# Patient Record
Sex: Male | Born: 1975 | Race: White | Hispanic: No | Marital: Single | Smoking: Current every day smoker
Health system: Southern US, Community
[De-identification: ages and names within clinical notes are randomized; demographics above are authoritative.]

## PROBLEM LIST (undated history)

## (undated) DIAGNOSIS — F329 Major depressive disorder, single episode, unspecified: Secondary | ICD-10-CM

## (undated) DIAGNOSIS — F1910 Other psychoactive substance abuse, uncomplicated: Secondary | ICD-10-CM

## (undated) DIAGNOSIS — B1920 Unspecified viral hepatitis C without hepatic coma: Secondary | ICD-10-CM

## (undated) DIAGNOSIS — F32A Depression, unspecified: Secondary | ICD-10-CM

---

## 2012-08-07 ENCOUNTER — Encounter (HOSPITAL_COMMUNITY): Payer: Self-pay | Admitting: Emergency Medicine

## 2012-08-07 ENCOUNTER — Emergency Department (HOSPITAL_COMMUNITY)
Admission: EM | Admit: 2012-08-07 | Discharge: 2012-08-07 | Disposition: A | Discharge status: Home / Self Care | Payer: No Typology Code available for payment source | Attending: Emergency Medicine | Admitting: Emergency Medicine

## 2012-08-07 ENCOUNTER — Emergency Department (HOSPITAL_COMMUNITY): Payer: Self-pay

## 2012-08-07 ENCOUNTER — Emergency Department (HOSPITAL_COMMUNITY): Payer: No Typology Code available for payment source

## 2012-08-07 DIAGNOSIS — F3289 Other specified depressive episodes: Secondary | ICD-10-CM | POA: Insufficient documentation

## 2012-08-07 DIAGNOSIS — S43109A Unspecified dislocation of unspecified acromioclavicular joint, initial encounter: Secondary | ICD-10-CM | POA: Insufficient documentation

## 2012-08-07 DIAGNOSIS — F0781 Postconcussional syndrome: Secondary | ICD-10-CM

## 2012-08-07 DIAGNOSIS — R51 Headache: Secondary | ICD-10-CM | POA: Insufficient documentation

## 2012-08-07 DIAGNOSIS — S43101A Unspecified dislocation of right acromioclavicular joint, initial encounter: Secondary | ICD-10-CM

## 2012-08-07 DIAGNOSIS — Y9389 Activity, other specified: Secondary | ICD-10-CM | POA: Insufficient documentation

## 2012-08-07 DIAGNOSIS — F329 Major depressive disorder, single episode, unspecified: Secondary | ICD-10-CM | POA: Insufficient documentation

## 2012-08-07 DIAGNOSIS — Z79899 Other long term (current) drug therapy: Secondary | ICD-10-CM | POA: Insufficient documentation

## 2012-08-07 DIAGNOSIS — R4182 Altered mental status, unspecified: Secondary | ICD-10-CM | POA: Insufficient documentation

## 2012-08-07 DIAGNOSIS — Y92410 Unspecified street and highway as the place of occurrence of the external cause: Secondary | ICD-10-CM | POA: Insufficient documentation

## 2012-08-07 HISTORY — DX: Major depressive disorder, single episode, unspecified: F32.9

## 2012-08-07 HISTORY — DX: Depression, unspecified: F32.A

## 2012-08-07 LAB — AMMONIA: Ammonia: 50 umol/L (ref 11–60)

## 2012-08-07 LAB — COMPREHENSIVE METABOLIC PANEL
Alkaline Phosphatase: 73 U/L (ref 39–117)
BUN: 14 mg/dL (ref 6–23)
CO2: 30 mEq/L (ref 19–32)
Chloride: 102 mEq/L (ref 96–112)
GFR calc Af Amer: >90 mL/min (ref >90)
GFR calc non Af Amer: >90 mL/min (ref >90)
Glucose, Bld: 81 mg/dL (ref 70–99)
Potassium: 3.8 mEq/L (ref 3.5–5.1)
Total Bilirubin: 0.9 mg/dL (ref 0.3–1.2)

## 2012-08-07 MED ORDER — OXYCODONE HCL 5 MG PO TABS: 5.0000 mg | ORAL_TABLET | ORAL | Status: DC | PRN

## 2012-08-07 MED ORDER — KETOROLAC TROMETHAMINE 60 MG/2ML IM SOLN
60.0000 mg | Freq: Once | INTRAMUSCULAR | Status: AC
  Administered 2012-08-07: 60 mg via INTRAMUSCULAR
  Filled 2012-08-07: qty 2

## 2012-08-07 MED ORDER — DIAZEPAM 5 MG PO TABS
5.0000 mg | ORAL_TABLET | Freq: Once | ORAL | Status: AC
  Administered 2012-08-07: 5 mg via ORAL
  Filled 2012-08-07: qty 1

## 2012-08-07 NOTE — ED Notes (Signed)
Has had hx of liver issues he states afterbitten by ticklast year and was told he had hep C
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
## 2012-08-08 NOTE — ED Provider Notes (Signed)
Medical screening examination/treatment/procedure(s) were performed by non-physician practitioner and as supervising physician I was immediately available for consultation/collaboration.  Zoila Ditullio L [place], MD 08/08/12 1911 

## 2017-04-21 ENCOUNTER — Other Ambulatory Visit: Payer: Self-pay

## 2017-04-21 ENCOUNTER — Emergency Department (HOSPITAL_COMMUNITY): Payer: Self-pay

## 2017-04-21 ENCOUNTER — Inpatient Hospital Stay (HOSPITAL_COMMUNITY)
Admission: EM | Admit: 2017-04-21 | Discharge: 2017-04-22 | DRG: 089 | Disposition: A | Discharge status: Home / Self Care | Payer: Self-pay | Attending: General Surgery | Admitting: General Surgery

## 2017-04-21 ENCOUNTER — Encounter (HOSPITAL_COMMUNITY): Payer: Self-pay | Admitting: Emergency Medicine

## 2017-04-21 DIAGNOSIS — Z886 Allergy status to analgesic agent status: Secondary | ICD-10-CM

## 2017-04-21 DIAGNOSIS — F1910 Other psychoactive substance abuse, uncomplicated: Secondary | ICD-10-CM | POA: Diagnosis present

## 2017-04-21 DIAGNOSIS — S060XAA Concussion with loss of consciousness status unknown, initial encounter: Secondary | ICD-10-CM | POA: Diagnosis present

## 2017-04-21 DIAGNOSIS — S060X9A Concussion with loss of consciousness of unspecified duration, initial encounter: Principal | ICD-10-CM | POA: Diagnosis present

## 2017-04-21 DIAGNOSIS — S41112A Laceration without foreign body of left upper arm, initial encounter: Secondary | ICD-10-CM

## 2017-04-21 DIAGNOSIS — Z23 Encounter for immunization: Secondary | ICD-10-CM

## 2017-04-21 DIAGNOSIS — Z79899 Other long term (current) drug therapy: Secondary | ICD-10-CM

## 2017-04-21 DIAGNOSIS — T07XXXA Unspecified multiple injuries, initial encounter: Secondary | ICD-10-CM

## 2017-04-21 DIAGNOSIS — S42435A Nondisplaced fracture (avulsion) of lateral epicondyle of left humerus, initial encounter for closed fracture: Secondary | ICD-10-CM | POA: Diagnosis present

## 2017-04-21 DIAGNOSIS — S42445A Nondisplaced fracture (avulsion) of medial epicondyle of left humerus, initial encounter for closed fracture: Secondary | ICD-10-CM

## 2017-04-21 DIAGNOSIS — S41119A Laceration without foreign body of unspecified upper arm, initial encounter: Secondary | ICD-10-CM | POA: Diagnosis present

## 2017-04-21 DIAGNOSIS — S50312A Abrasion of left elbow, initial encounter: Secondary | ICD-10-CM | POA: Diagnosis present

## 2017-04-21 DIAGNOSIS — R413 Other amnesia: Secondary | ICD-10-CM | POA: Diagnosis present

## 2017-04-21 DIAGNOSIS — Y92410 Unspecified street and highway as the place of occurrence of the external cause: Secondary | ICD-10-CM

## 2017-04-21 DIAGNOSIS — F17210 Nicotine dependence, cigarettes, uncomplicated: Secondary | ICD-10-CM | POA: Diagnosis present

## 2017-04-21 DIAGNOSIS — S52125A Nondisplaced fracture of head of left radius, initial encounter for closed fracture: Secondary | ICD-10-CM

## 2017-04-21 DIAGNOSIS — S0081XA Abrasion of other part of head, initial encounter: Secondary | ICD-10-CM | POA: Diagnosis present

## 2017-04-21 HISTORY — DX: Other psychoactive substance abuse, uncomplicated: F19.10

## 2017-04-21 HISTORY — DX: Unspecified viral hepatitis C without hepatic coma: B19.20

## 2017-04-21 LAB — CBC WITH DIFFERENTIAL/PLATELET
BASOS PCT: 0 %
Basophils Absolute: 0 10*3/uL (ref 0.0–0.1)
EOS ABS: 0 10*3/uL (ref 0.0–0.7)
Eosinophils Relative: 0 %
HCT: 41.2 % (ref 39.0–52.0)
Hemoglobin: 14 g/dL (ref 13.0–17.0)
Lymphocytes Relative: 10 %
Lymphs Abs: 1.5 10*3/uL (ref 0.7–4.0)
MCH: 30.2 pg (ref 26.0–34.0)
MCHC: 34 g/dL (ref 30.0–36.0)
MCV: 89 fL (ref 78.0–100.0)
MONO ABS: 0.8 10*3/uL (ref 0.1–1.0)
MONOS PCT: 5 %
Neutro Abs: 13 10*3/uL — ABNORMAL HIGH (ref 1.7–7.7)
Neutrophils Relative %: 85 %
PLATELETS: 214 10*3/uL (ref 150–400)
RBC: 4.63 MIL/uL (ref 4.22–5.81)
RDW: 13.3 % (ref 11.5–15.5)
WBC: 15.5 10*3/uL — ABNORMAL HIGH (ref 4.0–10.5)

## 2017-04-21 LAB — COMPREHENSIVE METABOLIC PANEL
ALBUMIN: 3.8 g/dL (ref 3.5–5.0)
ALK PHOS: 47 U/L (ref 38–126)
ALT: 45 U/L (ref 17–63)
AST: 45 U/L — ABNORMAL HIGH (ref 15–41)
Anion gap: 9 (ref 5–15)
BILIRUBIN TOTAL: 1.3 mg/dL — AB (ref 0.3–1.2)
BUN: 11 mg/dL (ref 6–20)
CALCIUM: 9.2 mg/dL (ref 8.9–10.3)
CO2: 25 mmol/L (ref 22–32)
CREATININE: 0.86 mg/dL (ref 0.61–1.24)
Chloride: 105 mmol/L (ref 101–111)
GFR calc non Af Amer: >60 mL/min (ref >60)
GLUCOSE: 109 mg/dL — AB (ref 65–99)
Potassium: 3.6 mmol/L (ref 3.5–5.1)
SODIUM: 139 mmol/L (ref 135–145)
TOTAL PROTEIN: 7.2 g/dL (ref 6.5–8.1)

## 2017-04-21 LAB — RAPID URINE DRUG SCREEN, HOSP PERFORMED
Amphetamines: POSITIVE — AB
BENZODIAZEPINES: POSITIVE — AB
Barbiturates: NOT DETECTED
Cocaine: NOT DETECTED
OPIATES: POSITIVE — AB
Tetrahydrocannabinol: POSITIVE — AB

## 2017-04-21 MED ORDER — LIDOCAINE-EPINEPHRINE (PF) 2 %-1:200000 IJ SOLN
10.0000 mL | Freq: Once | INTRAMUSCULAR | Status: AC
  Administered 2017-04-21: 10 mL
  Filled 2017-04-21: qty 20

## 2017-04-21 MED ORDER — MECLIZINE HCL 25 MG PO TABS: 25.0000 mg | ORAL_TABLET | Freq: Three times a day (TID) | ORAL | 0 refills | Status: DC | PRN

## 2017-04-21 MED ORDER — TETANUS-DIPHTH-ACELL PERTUSSIS 5-2.5-18.5 LF-MCG/0.5 IM SUSP
0.5000 mL | Freq: Once | INTRAMUSCULAR | Status: AC
  Administered 2017-04-21: 0.5 mL via INTRAMUSCULAR
  Filled 2017-04-21: qty 0.5

## 2017-04-21 MED ORDER — CEPHALEXIN 500 MG PO CAPS: 500.0000 mg | ORAL_CAPSULE | Freq: Three times a day (TID) | ORAL | 0 refills | Status: DC

## 2017-04-21 MED ORDER — CLINDAMYCIN PHOSPHATE 900 MG/50ML IV SOLN: 900.0000 mg | Freq: Once | INTRAVENOUS | Status: DC

## 2017-04-21 MED ORDER — LIDOCAINE-EPINEPHRINE-TETRACAINE (LET) SOLUTION
3.0000 mL | Freq: Once | NASAL | Status: AC
  Administered 2017-04-21: 3 mL via TOPICAL
  Filled 2017-04-21: qty 3

## 2017-04-21 MED ORDER — MECLIZINE HCL 25 MG PO TABS
25.0000 mg | ORAL_TABLET | Freq: Once | ORAL | Status: AC
  Administered 2017-04-22: 25 mg via ORAL
  Filled 2017-04-21: qty 1

## 2017-04-21 MED ORDER — NAPROXEN 500 MG PO TABS: 500.0000 mg | ORAL_TABLET | Freq: Two times a day (BID) | ORAL | 0 refills | Status: DC | PRN

## 2017-04-21 MED ORDER — METOCLOPRAMIDE HCL 5 MG/ML IJ SOLN
10.0000 mg | Freq: Once | INTRAMUSCULAR | Status: AC
  Administered 2017-04-21: 10 mg via INTRAVENOUS
  Filled 2017-04-21: qty 2

## 2017-04-21 MED ORDER — ONDANSETRON 4 MG PO TBDP: 4.0000 mg | ORAL_TABLET | Freq: Three times a day (TID) | ORAL | 0 refills | Status: AC | PRN

## 2017-04-21 MED ORDER — MORPHINE SULFATE (PF) 4 MG/ML IV SOLN
4.0000 mg | Freq: Once | INTRAVENOUS | Status: AC
  Administered 2017-04-21: 4 mg via INTRAVENOUS
  Filled 2017-04-21: qty 1

## 2017-04-21 MED ORDER — ONDANSETRON HCL 4 MG/2ML IJ SOLN
4.0000 mg | Freq: Once | INTRAMUSCULAR | Status: AC
  Administered 2017-04-21: 4 mg via INTRAVENOUS
  Filled 2017-04-21: qty 2

## 2017-04-21 MED ORDER — IOPAMIDOL (ISOVUE-300) INJECTION 61%
INTRAVENOUS | Status: AC
  Administered 2017-04-21: 15:00:00
  Filled 2017-04-21: qty 100

## 2017-04-21 NOTE — ED Provider Notes (Signed)
..Laceration Repair Date/Time: 04/21/2017 8:26 PM Performed by: Emi HolesLaw, Austine Wiedeman M, PA-C Authorized by: Emi HolesLaw, Trena Dunavan M, PA-C   Consent:    Consent obtained:  Verbal   Consent given by:  Patient   Risks discussed:  Infection, pain and poor cosmetic result   Alternatives discussed:  No treatment Anesthesia (see MAR for exact dosages):    Anesthesia method:  Topical application and local infiltration   Topical anesthetic:  LET   Local anesthetic:  Lidocaine 2% WITH epi Laceration details:    Location:  Shoulder/arm   Shoulder/arm location:  L elbow   Length (cm):  4   Depth (mm):  3 Repair type:    Repair type:  Intermediate Pre-procedure details:    Preparation:  Patient was prepped and draped in usual sterile fashion and imaging obtained to evaluate for foreign bodies Exploration:    Hemostasis achieved with:  Epinephrine and direct pressure   Wound exploration: wound explored through full range of motion and entire depth of wound probed and visualized     Contaminated: yes   Treatment:    Area cleansed with:  Saline   Amount of cleaning:  Extensive   Irrigation solution:  Sterile saline   Irrigation volume:  400 mL   Irrigation method:  Syringe   Visualized foreign bodies/material removed: no   Skin repair:    Repair method:  Sutures   Suture size:  4-0   Wound skin closure material used: Ethilon.   Suture technique:  Vertical mattress   Number of sutures:  2 Approximation:    Approximation:  Loose   Vermilion border: well-aligned   Post-procedure details:    Dressing:  Antibiotic ointment, splint for protection and non-adherent dressing (roll gauze)   Patient tolerance of procedure:  Tolerated well, no immediate complications .Marland Kitchen.Laceration Repair Date/Time: 04/21/2017 8:28 PM Performed by: Emi HolesLaw, Danaye Sobh M, PA-C Authorized by: Emi HolesLaw, Kacey Vicuna M, PA-C   Consent:    Consent obtained:  Verbal   Consent given by:  Patient   Risks discussed:  Infection, poor cosmetic  result and pain   Alternatives discussed:  No treatment Anesthesia (see MAR for exact dosages):    Anesthesia method:  Local infiltration   Local anesthetic:  Lidocaine 2% WITH epi Laceration details:    Location:  Shoulder/arm   Shoulder/arm location:  L elbow   Length (cm):  1   Depth (mm):  2 Repair type:    Repair type:  Simple Pre-procedure details:    Preparation:  Patient was prepped and draped in usual sterile fashion and imaging obtained to evaluate for foreign bodies Exploration:    Wound exploration: wound explored through full range of motion and entire depth of wound probed and visualized     Contaminated: no   Treatment:    Area cleansed with:  Saline   Irrigation solution:  Sterile water   Irrigation volume:  30 mL   Irrigation method:  Syringe   Visualized foreign bodies/material removed: no   Skin repair:    Repair method:  Sutures   Suture size:  4-0   Wound skin closure material used: Ethilon.   Suture technique:  Simple interrupted   Number of sutures:  1 Approximation:    Approximation:  Close   Vermilion border: well-aligned   Post-procedure details:    Dressing:  Antibiotic ointment, splint for protection and sterile dressing (roll gauze)   Patient tolerance of procedure:  Tolerated well, no immediate complications .Marland Kitchen.Laceration Repair Date/Time: 04/21/2017 8:30 PM Performed  by: Emi HolesLaw, Garry Nicolini M, PA-C Authorized by: Emi HolesLaw, Ishana Blades M, PA-C   Consent:    Consent obtained:  Verbal   Consent given by:  Patient   Risks discussed:  Infection, pain and poor cosmetic result   Alternatives discussed:  No treatment Anesthesia (see MAR for exact dosages):    Anesthesia method:  Local infiltration   Local anesthetic:  Lidocaine 2% WITH epi Laceration details:    Location:  Shoulder/arm   Shoulder/arm location:  L lower arm   Length (cm):  1   Depth (mm):  5 Repair type:    Repair type:  Simple Pre-procedure details:    Preparation:  Patient was prepped  and draped in usual sterile fashion and imaging obtained to evaluate for foreign bodies Exploration:    Hemostasis achieved with:  Direct pressure   Wound exploration: wound explored through full range of motion and entire depth of wound probed and visualized     Wound extent: foreign bodies/material     Contaminated: yes   Treatment:    Area cleansed with:  Saline   Amount of cleaning:  Standard   Irrigation solution:  Sterile water   Irrigation volume:  50 mL   Irrigation method:  Syringe   Visualized foreign bodies/material removed: yes   Skin repair:    Repair method:  Sutures   Suture size:  4-0   Wound skin closure material used: Ethilon.   Number of sutures:  1 Approximation:    Approximation:  Loose   Vermilion border: well-aligned   Post-procedure details:    Dressing:  Antibiotic ointment, splint for protection and sterile dressing   Patient tolerance of procedure:  Tolerated well, no immediate complications .Foreign Body Removal Date/Time: 04/21/2017 8:31 PM Performed by: Emi HolesLaw, Carzell Saldivar M, PA-C Authorized by: Emi HolesLaw, Alysiana Ethridge M, PA-C  Consent: Verbal consent obtained. Consent given by: patient Patient identity confirmed: verbally with patient Body area: skin General location: upper extremity Location details: left forearm Anesthesia: local infiltration  Anesthesia: Local Anesthetic: lidocaine 2% with epinephrine Anesthetic total: 2 mL  Sedation: Patient sedated: no  Patient restrained: no Patient cooperative: yes Localization method: visualized (X-ray) Removal mechanism: forceps Dressing: antibiotic ointment Tendon involvement: none Depth: subcutaneous Complexity: simple 1 objects recovered. Objects recovered: ~435mm cube of glass Post-procedure assessment: foreign body removed Patient tolerance: Patient tolerated the procedure well with no immediate complications      Emi HolesLaw, Mackensi Mahadeo M, PA-C 04/21/17 2032    Little, Ambrose Finlandachel Morgan, MD 04/21/17  2034
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
## 2017-04-21 NOTE — ED Notes (Signed)
Patient transported to CT 
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
## 2017-04-21 NOTE — ED Notes (Signed)
Pt stated that he was not willing to ambulate in the hallway, but did ambulate in the room prior to providing a urine sample. His parents stated that he was unsteady on his feet even attempting to stand still. While walking around the bed, his parents stated that they would be "unwilling to leave him alone".

## 2017-04-21 NOTE — Progress Notes (Signed)
Orthopedic Tech Progress Note Patient Details:  Dwayne Benson 04/18/1976 [id number]  Ortho Devices Type of Ortho Device: Shoulder immobilizer Ortho Device/Splint Location: LUE Ortho Device/Splint Interventions: Ordered, Application   Post Interventions Patient Tolerated: Well Instructions Provided: Care of device   Jennye MoccasinHughes, Margean Korell Craig 04/21/2017, 8:39 PM

## 2017-04-21 NOTE — ED Notes (Addendum)
Pt back from x-ray at 14:30 and also notified that he needs to provide a urine sample.
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
## 2017-04-21 NOTE — ED Notes (Signed)
Patient transported to X-ray 

## 2017-04-22 ENCOUNTER — Other Ambulatory Visit: Payer: Self-pay

## 2017-04-22 LAB — BASIC METABOLIC PANEL
ANION GAP: 6 (ref 5–15)
BUN: 9 mg/dL (ref 6–20)
CHLORIDE: 104 mmol/L (ref 101–111)
CO2: 27 mmol/L (ref 22–32)
Calcium: 8.8 mg/dL — ABNORMAL LOW (ref 8.9–10.3)
Creatinine, Ser: 0.7 mg/dL (ref 0.61–1.24)
Glucose, Bld: 110 mg/dL — ABNORMAL HIGH (ref 65–99)
POTASSIUM: 3.6 mmol/L (ref 3.5–5.1)
SODIUM: 137 mmol/L (ref 135–145)

## 2017-04-22 LAB — CBC
HCT: 39.3 % (ref 39.0–52.0)
HEMOGLOBIN: 13.2 g/dL (ref 13.0–17.0)
MCH: 29.9 pg (ref 26.0–34.0)
MCHC: 33.6 g/dL (ref 30.0–36.0)
MCV: 88.9 fL (ref 78.0–100.0)
PLATELETS: 217 10*3/uL (ref 150–400)
RBC: 4.42 MIL/uL (ref 4.22–5.81)
RDW: 13.1 % (ref 11.5–15.5)
WBC: 8.8 10*3/uL (ref 4.0–10.5)

## 2017-04-22 LAB — HIV ANTIBODY (ROUTINE TESTING W REFLEX): HIV Screen 4th Generation wRfx: NONREACTIVE

## 2017-04-22 MED ORDER — MECLIZINE HCL 25 MG PO TABS: 25.0000 mg | ORAL_TABLET | Freq: Three times a day (TID) | ORAL | 0 refills | Status: AC | PRN

## 2017-04-22 MED ORDER — OXYCODONE HCL 5 MG PO TABS: 5.0000 mg | ORAL_TABLET | ORAL | Status: DC | PRN

## 2017-04-22 MED ORDER — HYDRALAZINE HCL 20 MG/ML IJ SOLN: 10.0000 mg | INTRAMUSCULAR | Status: DC | PRN

## 2017-04-22 MED ORDER — CEPHALEXIN 500 MG PO CAPS: 500.0000 mg | ORAL_CAPSULE | Freq: Three times a day (TID) | ORAL | 0 refills | Status: AC

## 2017-04-22 MED ORDER — PANTOPRAZOLE SODIUM 40 MG IV SOLR: 40.0000 mg | Freq: Every day | INTRAVENOUS | Status: DC

## 2017-04-22 MED ORDER — ESCITALOPRAM OXALATE 10 MG PO TABS
20.0000 mg | ORAL_TABLET | Freq: Every day | ORAL | Status: DC
Start: 2017-04-22 — End: 2017-04-23
  Administered 2017-04-22: 20 mg via ORAL
  Filled 2017-04-22: qty 2

## 2017-04-22 MED ORDER — PANTOPRAZOLE SODIUM 40 MG PO TBEC
40.0000 mg | DELAYED_RELEASE_TABLET | Freq: Every day | ORAL | Status: DC
  Administered 2017-04-22: 40 mg via ORAL
  Filled 2017-04-22: qty 1

## 2017-04-22 MED ORDER — POTASSIUM CHLORIDE IN NACL 20-0.9 MEQ/L-% IV SOLN
INTRAVENOUS | Status: DC
  Administered 2017-04-22: 01:00:00 via INTRAVENOUS
  Filled 2017-04-22: qty 1000

## 2017-04-22 MED ORDER — MORPHINE SULFATE (PF) 2 MG/ML IV SOLN
2.0000 mg | INTRAVENOUS | Status: DC | PRN
  Administered 2017-04-22: 2 mg via INTRAVENOUS
  Filled 2017-04-22: qty 1

## 2017-04-22 MED ORDER — IBUPROFEN 200 MG PO TABS
600.0000 mg | ORAL_TABLET | Freq: Four times a day (QID) | ORAL | Status: DC | PRN
  Administered 2017-04-22: 600 mg via ORAL
  Filled 2017-04-22: qty 3

## 2017-04-22 MED ORDER — OXYCODONE HCL 5 MG PO TABS
10.0000 mg | ORAL_TABLET | ORAL | Status: DC | PRN
Start: 2017-04-22 — End: 2017-04-23
  Administered 2017-04-22 (×4): 10 mg via ORAL
  Filled 2017-04-22 (×4): qty 2

## 2017-04-22 MED ORDER — NAPROXEN 500 MG PO TABS: 500.0000 mg | ORAL_TABLET | Freq: Two times a day (BID) | ORAL | 0 refills | Status: AC | PRN

## 2017-04-22 MED ORDER — ENOXAPARIN SODIUM 40 MG/0.4ML ~~LOC~~ SOLN
40.0000 mg | SUBCUTANEOUS | Status: DC
  Administered 2017-04-22: 40 mg via SUBCUTANEOUS
  Filled 2017-04-22: qty 0.4

## 2017-04-22 MED ORDER — OXYCODONE HCL 5 MG PO TABS: 5.0000 mg | ORAL_TABLET | ORAL | 0 refills | Status: DC | PRN

## 2017-04-22 MED ORDER — OXYCODONE HCL 5 MG PO TABS: 5.0000 mg | ORAL_TABLET | ORAL | 0 refills | Status: AC | PRN

## 2017-04-22 MED ORDER — ONDANSETRON 4 MG PO TBDP
4.0000 mg | ORAL_TABLET | Freq: Four times a day (QID) | ORAL | Status: DC | PRN
  Filled 2017-04-22: qty 1

## 2017-04-22 MED ORDER — ONDANSETRON HCL 4 MG/2ML IJ SOLN
4.0000 mg | Freq: Four times a day (QID) | INTRAMUSCULAR | Status: DC | PRN
  Administered 2017-04-22: 4 mg via INTRAVENOUS
  Filled 2017-04-22: qty 2
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
## 2017-04-22 NOTE — Progress Notes (Signed)
Pt given D/C instructions, fiance and mother at bedside verbalized understanding. Pt IV removed, belongings gathered, and pt transported via w/c to ED to pick up belongings left with security.

## 2017-04-22 NOTE — Plan of Care (Signed)
  Progressing Education: Knowledge of General Education information will improve 04/22/2017 1145 - Progressing by Darreld Mcleanox, Dejion Grillo, RN Health Behavior/Discharge Planning: Ability to manage health-related needs will improve 04/22/2017 1145 - Progressing by Darreld Mcleanox, Nusrat Encarnacion, RN Clinical Measurements: Ability to maintain clinical measurements within normal limits will improve 04/22/2017 1145 - Progressing by Darreld Mcleanox, See Beharry, RN Will remain free from infection 04/22/2017 1145 - Progressing by Darreld Mcleanox, Chayanne Speir, RN Diagnostic test results will improve 04/22/2017 1145 - Progressing by Darreld Mcleanox, Triniti Gruetzmacher, RN Respiratory complications will improve 04/22/2017 1145 - Progressing by Darreld Mcleanox, Robertt Buda, RN Cardiovascular complication will be avoided 04/22/2017 1145 - Progressing by Darreld Mcleanox, Nicki Gracy, RN Activity: Risk for activity intolerance will decrease 04/22/2017 1145 - Progressing by Darreld Mcleanox, Kyleen Villatoro, RN Nutrition: Adequate nutrition will be maintained 04/22/2017 1145 - Progressing by Darreld Mcleanox, Reizel Calzada, RN Coping: Level of anxiety will decrease 04/22/2017 1145 - Progressing by Darreld Mcleanox, Alanea Woolridge, RN Elimination: Will not experience complications related to bowel motility 04/22/2017 1145 - Progressing by Darreld Mcleanox, Yee Gangi, RN Will not experience complications related to urinary retention 04/22/2017 1145 - Progressing by Darreld Mcleanox, Koralyn Prestage, RN Pain Managment: General experience of comfort will improve 04/22/2017 1145 - Progressing by Darreld Mcleanox, Adaiah Morken, RN Safety: Ability to remain free from injury will improve 04/22/2017 1145 - Progressing by Darreld Mcleanox, Brityn Mastrogiovanni, RN Skin Integrity: Risk for impaired skin integrity will decrease 04/22/2017 1145 - Progressing by Darreld Mcleanox, Julyanna Scholle, RN

## 2017-04-22 NOTE — Discharge Instructions (Signed)
Concussion, Adult  A concussion is a brain injury from a direct hit (blow) to the head or body. This injury causes the brain to shake quickly back and forth inside the skull. It is caused by:   A hit to the head.   A quick and sudden movement (jolt) of the head or neck.    How fast you will get better from a concussion depends on many things like how bad your concussion was, what part of your brain was hurt, how old you are, and how healthy you were before the concussion. Recovery can take time. It is important to wait to return to activity until a doctor says it is safe and your symptoms are all gone.  Follow these instructions at home:  Activity   Limit activities that need a lot of thought or concentration. These include:  ? Homework or work for your job.  ? Watching TV.  ? Computer work.  ? Playing memory games and puzzles.   Rest. Rest helps the brain to heal. Make sure you:  ? Get plenty of sleep at night. Do not stay up late.  ? Go to bed at the same time every day.  ? Rest during the day. Take naps or rest breaks when you feel tired.   It can be dangerous if you get another concussion before the first one has healed Do not do activities that could cause a second concussion, such as riding a bike or playing sports.   Ask your doctor when you can return to your normal activities, like driving, riding a bike, or using machinery. Your ability to react may be slower. Do not do these activities if you are dizzy. Your doctor will likely give you a plan for slowly going back to activities.  General instructions   Take over-the-counter and prescription medicines only as told by your doctor.   Do not drink alcohol until your doctor says you can.   If it is harder than usual to remember things, write them down.   If you are easily distracted, try to do one thing at a time. For example, do not try to watch TV while making dinner.   Talk with family members or close friends when you need to make important  decisions.   Watch your symptoms and tell other people to do the same. Other problems (complications) can happen after a concussion. Older adults with a brain injury may have a higher risk of serious problems, such as a blood clot in the brain.   Tell your teachers, school nurse, school counselor, coach, athletic trainer, or work manager about your injury and symptoms. Tell them about what you can or cannot do. They should watch for:  ? More problems with attention or concentration.  ? More trouble remembering or learning new information.  ? More time needed to do tasks or assignments.  ? Being more annoyed (irritable) or having a harder time dealing with stress.  ? Any other symptoms that get worse.   Keep all follow-up visits as told by your health care provider. This is important.  Prevention   It is very important that you donot get another brain injury, especially before you have healed. In rare cases, another injury can cause permanent brain damage, brain swelling, or death. You have the most risk if you get another head injury in the first 7-10 days after you were hurt before. To avoid injuries:  ? Wear a seat belt when you ride in   a car.  ? Do not drink too much alcohol.  ? Avoid activities that could make you get a second concussion, like contact sports.  ? Wear a helmet when you do activities like:   Biking.   Skiing.   Skateboarding.   Skating.  ? Make your home safe by:   Removing things from the floor or stairs that could make you trip.   Using grab bars in bathrooms and handrails by stairs.   Placing non-slip mats on floors and in bathtubs.   Putting more light in dark areas.  Contact a doctor if:   Your symptoms get worse.   You have new symptoms.   You keep having symptoms for more than 2 weeks.  Get help right away if:   You have bad headaches, or your headaches get worse.   You have weakness in any part of your body.   You have loss of feeling (numbness).   You feel off  balance.   You keep throwing up (vomiting).   You feel more sleepy.   The black center of one eye (pupil) is bigger than the other one.   You twitch or shake violently (convulse) or have a seizure.   Your speech is not clear (is slurred).   You feel more tired, more confused, or more annoyed.   You do not recognize people or places.   You have neck pain.   It is hard to wake you up.   You have strange behavior changes.   You pass out (lose consciousness).  Summary   A concussion is a brain injury from a direct hit (blow) to the head or body.   This condition is treated with rest and careful watching of symptoms.   If you keep having symptoms for more than 2 weeks, call your doctor.  This information is not intended to replace advice given to you by your health care provider. Make sure you discuss any questions you have with your health care provider.  Document Released: 04/21/2009 Document Revised: 04/17/2016 Document Reviewed: 04/17/2016  Elsevier Interactive Patient Education  2017 [employer]
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
